# Patient Record
Sex: Male | Born: 1990 | Race: Black or African American | Hispanic: No | Marital: Single
Health system: Southern US, Community
[De-identification: ages and names within clinical notes are randomized; demographics above are authoritative.]

---
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
## 2014-12-01 ENCOUNTER — Emergency Department (HOSPITAL_COMMUNITY)
Admission: EM | Admit: 2014-12-01 | Discharge: 2014-12-01 | Disposition: A | Discharge status: Home / Self Care | Payer: BLUE CROSS/BLUE SHIELD | Attending: Emergency Medicine | Admitting: Emergency Medicine

## 2014-12-01 ENCOUNTER — Encounter (HOSPITAL_COMMUNITY): Payer: Self-pay

## 2014-12-01 DIAGNOSIS — F10920 Alcohol use, unspecified with intoxication, uncomplicated: Secondary | ICD-10-CM | POA: Insufficient documentation

## 2014-12-01 DIAGNOSIS — F1020 Alcohol dependence, uncomplicated: Secondary | ICD-10-CM | POA: Diagnosis present

## 2014-12-01 DIAGNOSIS — Z720 Tobacco use: Secondary | ICD-10-CM | POA: Insufficient documentation

## 2014-12-01 DIAGNOSIS — R45851 Suicidal ideations: Secondary | ICD-10-CM

## 2014-12-01 LAB — RAPID URINE DRUG SCREEN, HOSP PERFORMED
Amphetamines: NOT DETECTED
Barbiturates: NOT DETECTED
Benzodiazepines: NOT DETECTED
Cocaine: NOT DETECTED
Opiates: NOT DETECTED
Tetrahydrocannabinol: NOT DETECTED

## 2014-12-01 LAB — CBC
HCT: 43 % (ref 39.0–52.0)
Hemoglobin: 14.7 g/dL (ref 13.0–17.0)
MCH: 30.4 pg (ref 26.0–34.0)
MCHC: 34.2 g/dL (ref 30.0–36.0)
MCV: 88.8 fL (ref 78.0–100.0)
Platelets: 195 10*3/uL (ref 150–400)
RBC: 4.84 MIL/uL (ref 4.22–5.81)
RDW: 12.1 % (ref 11.5–15.5)
WBC: 7.2 10*3/uL (ref 4.0–10.5)

## 2014-12-01 LAB — COMPREHENSIVE METABOLIC PANEL
ALT: 16 U/L (ref 0–53)
AST: 18 U/L (ref 0–37)
Albumin: 5 g/dL (ref 3.5–5.2)
Alkaline Phosphatase: 31 U/L — ABNORMAL LOW (ref 39–117)
Anion gap: 8 (ref 5–15)
BUN: 14 mg/dL (ref 6–23)
CO2: 26 mmol/L (ref 19–32)
Calcium: 9.2 mg/dL (ref 8.4–10.5)
Chloride: 102 mmol/L (ref 96–112)
Creatinine, Ser: 0.73 mg/dL (ref 0.50–1.35)
GFR calc Af Amer: >90 mL/min (ref >90)
GFR calc non Af Amer: >90 mL/min (ref >90)
Glucose, Bld: 90 mg/dL (ref 70–99)
Potassium: 4 mmol/L (ref 3.5–5.1)
Sodium: 136 mmol/L (ref 135–145)
Total Bilirubin: 0.4 mg/dL (ref 0.3–1.2)
Total Protein: 7.8 g/dL (ref 6.0–8.3)

## 2014-12-01 LAB — SALICYLATE LEVEL: Salicylate Lvl: <4 mg/dL (ref 2.8–20.0)

## 2014-12-01 LAB — ACETAMINOPHEN LEVEL: Acetaminophen (Tylenol), Serum: <10 ug/mL — ABNORMAL LOW (ref 10–30)

## 2014-12-01 LAB — ETHANOL: Alcohol, Ethyl (B): 121 mg/dL — ABNORMAL HIGH (ref 0–9)

## 2014-12-01 MED ORDER — ACETAMINOPHEN 325 MG PO TABS: 650.0000 mg | ORAL_TABLET | ORAL | Status: DC | PRN

## 2014-12-01 MED ORDER — ONDANSETRON HCL 4 MG PO TABS: 4.0000 mg | ORAL_TABLET | Freq: Three times a day (TID) | ORAL | Status: DC | PRN

## 2014-12-01 MED ORDER — IBUPROFEN 200 MG PO TABS: 600.0000 mg | ORAL_TABLET | Freq: Three times a day (TID) | ORAL | Status: DC | PRN
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
## 2014-12-01 NOTE — Discharge Instructions (Signed)
Alcohol Intoxication Alcohol intoxication occurs when the amount of alcohol that a person has consumed impairs his or her ability to mentally and physically function. Alcohol directly impairs the normal chemical activity of the brain. Drinking large amounts of alcohol can lead to changes in mental function and behavior, and it can cause many physical effects that can be harmful.  Alcohol intoxication can range in severity from mild to very severe. Various factors can affect the level of intoxication that occurs, such as the person's age, gender, weight, frequency of alcohol consumption, and the presence of other medical conditions (such as diabetes, seizures, or heart conditions). Dangerous levels of alcohol intoxication may occur when people drink large amounts of alcohol in a short period (binge drinking). Alcohol can also be especially dangerous when combined with certain prescription medicines or "recreational" drugs. SIGNS AND SYMPTOMS Some common signs and symptoms of mild alcohol intoxication include:  Loss of coordination.  Changes in mood and behavior.  Impaired judgment.  Slurred speech. As alcohol intoxication progresses to more severe levels, other signs and symptoms will appear. These may include:  Vomiting.  Confusion and impaired memory.  Slowed breathing.  Seizures.  Loss of consciousness. DIAGNOSIS  Your health care provider will take a medical history and perform a physical exam. You will be asked about the amount and type of alcohol you have consumed. Blood tests will be done to measure the concentration of alcohol in your blood. In many places, your blood alcohol level must be lower than 80 mg/dL (0.08%) to legally drive. However, many dangerous effects of alcohol can occur at much lower levels.  TREATMENT  People with alcohol intoxication often do not require treatment. Most of the effects of alcohol intoxication are temporary, and they go away as the alcohol naturally  leaves the body. Your health care provider will monitor your condition until you are stable enough to go home. Fluids are sometimes given through an IV access tube to help prevent dehydration.  HOME CARE INSTRUCTIONS  Do not drive after drinking alcohol.  Stay hydrated. Drink enough water and fluids to keep your urine clear or pale yellow. Avoid caffeine.   Only take over-the-counter or prescription medicines as directed by your health care provider.  SEEK MEDICAL CARE IF:   You have persistent vomiting.   You do not feel better after a few days.  You have frequent alcohol intoxication. Your health care provider can help determine if you should see a substance use treatment counselor. SEEK IMMEDIATE MEDICAL CARE IF:   You become shaky or tremble when you try to stop drinking.   You shake uncontrollably (seizure).   You throw up (vomit) blood. This may be bright red or may look like black coffee grounds.   You have blood in your stool. This may be bright red or may appear as a black, tarry, bad smelling stool.   You become lightheaded or faint.  MAKE SURE YOU:   Understand these instructions.  Will watch your condition.  Will get help right away if you are not doing well or get worse. Document Released: 07/03/2005 Document Revised: 05/26/2013 Document Reviewed: 02/26/2013 [facility] Patient Information 2015 [place], [place]. This information is not intended to replace advice given to you by your health care provider. Make sure you discuss any questions you have with your health care provider. Suicidal Feelings, How to Help Yourself Everyone feels sad or unhappy at times, but depressing thoughts and feelings of hopelessness can lead to thoughts of suicide. It  can seem as if life is too tough to handle. If you feel as though you have reached the point where suicide is the only answer, it is time to let someone know immediately.  HOW TO COPE AND PREVENT SUICIDE  Let family,  friends, teachers, or counselors know. Get help. Try not to isolate yourself from those who care about you. Even though you may not feel sociable, talk with someone every day. It is best if it is face-to-face. Remember, they will want to help you.  Eat a regularly spaced and well-balanced diet.  Get plenty of rest.  Avoid alcohol and drugs because they will only make you feel worse and may also lower your inhibitions. Remove them from the home. If you are thinking of taking an overdose of your prescribed medicines, give your medicines to someone who can give them to you one day at a time. If you are on antidepressants, let your caregiver know of your feelings so he or she can provide a safer medicine, if that is a concern.  Remove weapons or poisons from your home.  Try to stick to routines. Follow a schedule and remind yourself that you have to keep that schedule every day.  Set some realistic goals and achieve them. Make a list and cross things off as you go. Accomplishments give a sense of worth. Wait until you are feeling better before doing things you find difficult or unpleasant to do.  If you are able, try to start exercising. Even half-hour periods of exercise each day will make you feel better. Getting out in the sun or into nature helps you recover from depression faster. If you have a favorite place to walk, take advantage of that.  Increase safe activities that have always given you pleasure. This may include playing your favorite music, reading a good book, painting a picture, or playing your favorite instrument. Do whatever takes your mind off your depression.  Keep your living space well-lighted. GET HELP Contact a suicide hotline, crisis center, or local suicide prevention center for help right away. Local centers may include a hospital, clinic, community service organization, social service provider, or health department.  Call your local emergency services (911 in the [place]).  Call a suicide hotline:  1-800-273-TALK (1-949-593-2585) in the [place].  1-800-SUICIDE (570)305-2082) in the [place].  215-366-7226 in the [place] for Spanish-speaking counselors.  5-462-703-5KKX 973-309-1866) in the [place] for TTY users.  Visit the following websites for information and help:  National Suicide Prevention Lifeline: www.suicidepreventionlifeline.org  Hopeline: www.hopeline.[place] for Suicide Prevention: PromotionalLoans.co.za  For lesbian, gay, bisexual, transgender, or questioning youth, contact [employer]:  9-678-9-F-YBOFBP (435)676-5514) in the [place].  www.thetrevorproject.org  In [place], treatment resources are listed in each [employer] with listings available under USAA for [employer] or similar titles. Another source for Crisis Centres by [place] is located at http://www.suicideprevention.ca/in-crisis-now/find-a-crisis-centre-now/crisis-centres Document Released: 03/30/2003 Document Revised: 12/16/2011 Document Reviewed: 01/18/2014 [employer] [facility] Patient Information 2015 [place], [place]. This information is not intended to replace advice given to you by your health care provider. Make sure you discuss any questions you have with your health care provider.  Emergency Department Resource Guide 1) Find a Doctor and Pay Out of Pocket Although you won't have to find out who is covered by your insurance plan, it is a good idea to ask around and get recommendations. You will then need to call the office and see if the doctor you have  chosen will accept you as a new patient and what types of options they offer for patients who are self-pay. Some doctors offer discounts or will set up payment plans for their patients who do not have insurance, but you will need to ask so you aren't surprised when you get to your appointment.  2) Contact Your Local Health Department Not all health departments  have doctors that can see patients for sick visits, but many do, so it is worth a call to see if yours does. If you don't know where your local health department is, you can check in your phone book. The CDC also has a tool to help you locate your state's health department, and many state websites also have listings of all of their local health departments.  3) Find a [facility] If your illness is not likely to be very severe or complicated, you may want to try a walk in clinic. These are popping up all over the country in pharmacies, drugstores, and shopping centers. They're usually staffed by nurse practitioners or physician assistants that have been trained to treat common illnesses and complaints. They're usually fairly quick and inexpensive. However, if you have serious medical issues or chronic medical problems, these are probably not your best option.  No Primary Care Doctor: - Call Health Connect at  [phone] - they can help you locate a primary care doctor that  accepts your insurance, provides certain services, etc. - Physician Referral Service- [phone]  Chronic Pain Problems: Organization         Address  Phone   Notes  [facility]  [phone] Patients need to be referred by their primary care doctor.   Medication Assistance: Organization         Address  Phone   Notes  [facility] Medication [facility] [address] [place], [place] [address] [phone] --Must be a resident of [facility] -- Must have NO insurance coverage whatsoever (no Medicaid/ Medicare, etc.) -- The pt. MUST have a primary care doctor that directs their care regularly and follows them in the community   MedAssist  [phone]   [employer]  [phone]    Agencies that provide inexpensive medical care: Organization         Address  Phone   Notes  [place]  [phone]   [facility]    [phone]    [facility] [place], [place] [address] [phone]   [place] [address] [place] [phone]   Planned Parenthood    [phone]   [facility]    [phone]   [place] and [place], [place] Phone:  [phone], Fax:  [phone] Hours of Operation:  9 am - 6 pm, M-F.  Also accepts Medicaid/Medicare and self-pay.  [facility] for [place] [address], [address], [place] Phone: [phone], Fax: [phone]. Hours of Operation:  8:30 am - 5:30 pm, M-F.  Also accepts Medicaid and self-pay.  [facility] [place] [address], [place] Phone: [phone]   [place], [address], [place] [phone], Ext. [phone] Mondays & Thursdays: 7-9 AM.  First 15 patients are seen on a first come, first serve basis.    [place] Providers:  Organization  Address  Phone   Notes  [facility] [address], [place] [phone] Also accepts self-pay patients.  [facility] [address], [place]  [phone]   [place], [address], [place] [phone]   [facility] [address] [place] [phone]   Lucianne Lei [address], [place]   [phone] Only accepts [place] Access [place] patients after they have their name applied to their card.   Self-Pay (no insurance) in [facility]:  Organization         Address  Phone   Notes  Sickle Cell Patients, [facility] [place] [phone]   [facility]gent Care [place] [phone]   [facility] [place]  [place], [place],  [phone]   [facility]/Dr. Osei-Bonsu  [address] [place] or [address], [employer] [phone] Phone number for both [place] and [place] locations is the same.  Urgent Medical and [facility] [address] [place] [phone]   [facility] [address], [place] or [address] [phone]   [facility] [address], [place] [phone], phone; [phone], fax Sees patients 1st and 3rd Saturday of every month.  Must not qualify for public or private insurance (i.e. Medicaid, Medicare, [place] Health Choice, Veterans' Benefits)  Household income should be no more than 200% of the poverty level The clinic cannot treat you if you are pregnant or think you are pregnant  Sexually transmitted diseases are not treated at the clinic.    Dental Care: Organization         Address  Phone  Notes  [facility] Department of [facility] [place] [phone] Accepts children up to age 23 who are enrolled in [place] or [place]; pregnant women with a Medicaid card; and children who have applied for Medicaid or [place] Health Choice, but were declined, whose parents can pay a reduced fee at time of service.  [facility] Department of [facility]  [address], [place] [phone] Accepts children up to age 56 who are enrolled in [place] or [place]; pregnant women with a Medicaid card; and children who have applied for Medicaid or [place] Health Choice, but were declined, whose parents can pay a reduced fee at time of service.  [place] Adult Dental Access PROGRAM  [place] [phone] Patients are seen by appointment only. Walk-ins are not accepted. [place] will see patients 84 years of age and older. Monday - Tuesday (8am-5pm) Most Wednesdays (8:30-5pm) $30 per visit, cash only  [facility]  Adult Dental Access PROGRAM  [address], [facility] [phone] Patients are seen by appointment only. Walk-ins are not accepted. [place] will see patients 21 years of age and older. One Wednesday Evening (Monthly: Volunteer Based).  $30 per visit, cash only  [employer]  [phone] for adults; Children under age 81, call Graduate Pediatric Dentistry at [phone]. Children aged 71-14, please call [phone] to request a pediatric application.  Dental services are provided in all areas of dental care including  fillings, crowns and bridges, complete and partial dentures, implants, gum treatment, root canals, and extractions. Preventive care is also provided. Treatment is provided to both adults and children. Patients are selected via a lottery and there is often a waiting list.   [facility] [address] [place]  [phone] www.drcivils.com   [facility] [address] [place], [place] [phone], Ext. [phone] Second and Fourth Thursday of each month, opens at 6:30 AM; Clinic ends at 9 AM.  Patients are seen on a first-come first-served basis, and a limited number are seen during each clinic.   [facility]  [address] Hillard Danker [place], [place] [phone]   Eligibility Requirements You must have lived in [place], [place], or [place] counties for at least the last three months.   You cannot be eligible for state or federal sponsored [employer], including [employer], [place], or [employer].   You generally cannot be eligible for healthcare insurance through your employer.    How to apply: Eligibility screenings are held every Tuesday and Wednesday afternoon from 1:00 pm until 4:00 pm. You do not need an appointment for the interview!  [facility] [address] [place], [place]   [place]  [place] Department  [place]  [phone]    Behavioral Health Resources in the Community: Intensive Outpatient Programs Organization         Address  Phone  Notes  [place]. [address] [place], [place] [phone]   [facility] Outpatient [address], [place], [place]   ADS: Alcohol & Drug Svcs [address] [place], [place]   [place] [address]  [place], [place] or [phone]   Substance Abuse Resources Organization         Address  Phone  Notes  Alcohol and Drug Services  ([phone]   [place]  [phone]   The [place]   Chinita Pester  [phone]   Residential & Outpatient Substance Abuse Program  [phone]   Psychological Services Organization         Address  Phone  Notes  [facility] [place]  [place]  [phone]   [place] [address] [place] or [phone]    Mobile Crisis Teams Organization         Address  Phone  Notes  [facility], Mobile Crisis Care Unit  [phone]   [facility]  [address] [place], [place]   [place] [address] [employer] [phone]    Self-Help/Support Groups Organization         Address  Phone             Notes  [place]. of [place] - variety of support groups  [place] Call for more information  Narcotics Anonymous (NA), [facility] [address], [employer] [place]  2 meetings at this location   Special educational needs teacher         Address  Phone  Notes  [facility] [place],    [place]  [place]  [address], [place] [address]17, [place], [place]   [place],  [place] [phone] Admissions: 8am-3pm M-F  Incentives Substance [place] [address]    [place], [place] [phone]   [facility] [address] [place], [place], [place]   [facility] [address]  [place], [place]   [facility] Programs - Intensive Outpatient [address] Kristeen Mans [address], [employer], [place]   [facility] [address].,  [place], [place] [phone]-([phone] or [phone]   [facility] Treatment Services (RTS) [address] [place], [place] Accepts Medicaid  [facility] [place] [address]  [place] 1-989-464-3372 Substance Abuse/Addiction Treatment   [facility] Organization         Address  Phone  Notes  [facility]  [phone]   Domenic Schwab, PhD [address] Arlis Porta [place], [place]   [phone] or [phone]   [place], [place] [phone]   [facility] [address] [place], [place] [phone] Insurance/Medicaid/sponsorship through [facility] and Families [address]                                    [place], [place] [phone] [place] [address] [place], [place] [phone]    Dr. Adele Schilder  [phone]   [facility] Dept. 1) [address], [place] 2) [place] 3)  [place] [address], [place] [phone]  [phone]   [place] [phone] or [phone] (After Hours)

## 2014-12-01 NOTE — BH Assessment (Signed)
Patient presented to [facility] under IVC. The psychiatrist Dr. Darleene Cleaver, MD will rescind IVC. Once all IVC has been rescinded patient's nurse will be made aware to prepare patient for final discharge.

## 2014-12-01 NOTE — ED Notes (Signed)
Patient arrived via [facility] officer with IVC papers.  Officer reports that patient was pulled over for DUI a couple hours ago and has made several remarks to the officer along the lines of suicide.  He has said things like, "I want to end it," and "I wish you could just shoot me."

## 2014-12-01 NOTE — ED Notes (Signed)
Patient undressed and wanded.  Now wearing paper scrubs.  Officer at bedside informed he could leave at [place] if patient continues to be cooperative.

## 2014-12-01 NOTE — ED Notes (Signed)
Pt getting dressed.

## 2014-12-01 NOTE — ED Notes (Signed)
Refused breakfast tray

## 2014-12-01 NOTE — BH Assessment (Signed)
Assessment Note  Adam Baird is an 24 y.o. male that presented to [facility] via [employer]. Patient reportedly obtained a DUI last night. Sts that he was arrested and placed in the back of a [employer]'s car.  He admits to telling the officer as he was sitting in the car, "Life doesn't matter". Writer asked patient to elaborate about his thoughts related to suicide and/or depression. The patient reports that he has had suicidal thoughts since child hood. Sts, "I've always been sad and felt down". Patient does not have a history of suicidal attempts and/or gestures. No self mutilating behaviors. Patient even denies a suicide plan with this Probation officer. However, ED notes indicate a  plan that he's had at times was to hang himself.  Patient did not report specific events that it caused him to feel depressed or despondent. However, patient did in fact obtain a DUI last night and has a upcoming court date. Patient later recognized this as a upcoming stressor.  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 He doesn't have any family supports. Lives with a brother who is home 2-3x's per week. Patient denies drug use. His UDS was negative. Patient admits that he drinks alcohol daily or every other day at the very least. His last drink was yesterday. No withdrawal symptoms reported. No history of seizures indicated. No hx of black outs.   Axis I: Alcohol Dependence and Depressive Disorder NOS Axis II: Deferred Axis III: History reviewed. No pertinent past medical history. Axis IV: other psychosocial or environmental problems, problems related to social environment, problems with access to health care services and problems with primary support group Axis V: 31-40 impairment in reality testing  Past Medical History: History reviewed. No pertinent past medical history.  History reviewed. No pertinent past surgical history.  Family History: History reviewed. No pertinent family history.  Social History:  reports  that he has been smoking.  He does not have any smokeless tobacco history on file. He reports that he drinks alcohol. He reports that he does not use illicit drugs.  Additional Social History:  Alcohol / Drug Use Pain Medications: SEE MAR Prescriptions: SEE MAR Over the Counter: SEE MAR History of alcohol / drug use?: Yes Substance #1 Name of Substance 1: Alcohol  1 - Age of First Use: "16 or 24 yrs old" 1 - Amount (size/oz): up to 4 beers 1 - Frequency: "daily or every other day" 1 - Duration: on-going  1 - Last Use / Amount: 12/01/2014; patient reports drinking approx. 4 beers  CIWA: CIWA-Ar BP: 128/89 mmHg Pulse Rate: 68 COWS:    Allergies: No Known Allergies  Home Medications:  (Not in a hospital admission)  OB/GYN Status:  No LMP for male patient.  General Assessment Data Location of Assessment: [facility] ED Is this a Tele or Face-to-Face Assessment?: Face-to-Face Is this an Initial Assessment or a Re-assessment for this encounter?: Initial Assessment Living Arrangements: Other (Comment) Can pt return to current living arrangement?: No Admission Status: Voluntary Is patient capable of signing voluntary admission?: Yes Transfer from: [facility] Referral Source: Self/Family/Friend  Medical Screening Exam ([place]) Medical Exam completed: No  [place] Living Arrangements: Other (Comment) Name of Psychiatrist:  (No psychiatrist ) Name of Therapist:  (No therapist )  Education Status Is patient currently in school?: No  Risk to self with the past 6 months Suicidal Ideation: Yes-Currently Present Suicidal Intent: No Is patient at risk for suicide?:  No Suicidal Plan?: No Access to Means: No What has been your use of drugs/alcohol within the last 12 months?:  (n/a) Previous Attempts/Gestures: No How many times?:  (n/a) Other Self Harm Risks:  (n/a) Triggers for Past Attempts: Other (Comment) (patient ) Intentional Self Injurious Behavior:  None Family Suicide History: No Recent stressful life event(s): Other (Comment) (patient does not identify any stressors (in school and works) Persecutory voices/beliefs?: No Depression: Yes Depression Symptoms: Feeling angry/irritable, Feeling worthless/self pity, Loss of interest in usual pleasures, Guilt, Fatigue, Isolating, Tearfulness, Insomnia, Despondent Substance abuse history and/or treatment for substance abuse?: No Suicide prevention information given to non-admitted patients: Not applicable  Risk to Others within the past 6 months Homicidal Ideation: No Thoughts of Harm to Others: No Current Homicidal Intent: No Current Homicidal Plan: No Access to Homicidal Means: No Identified Victim:  (n/a) History of harm to others?: No Assessment of Violence: None Noted Violent Behavior Description:  (patient is calm and cooperative ) Does patient have access to weapons?: No Criminal Charges Pending?: Yes Describe Pending Criminal Charges:  (patient obtained a DUI last night ) Does patient have a court date: No  Psychosis Hallucinations: None noted Delusions: None noted  Mental Status Report Appear/Hygiene: Disheveled Eye Contact: Good Motor Activity: Freedom of movement Speech: Logical/coherent Level of Consciousness: Alert Mood: Depressed Affect: Appropriate to circumstance Anxiety Level: None Thought Processes: Coherent, Relevant Judgement: Impaired Orientation: Person, Place, Time, Situation Obsessive Compulsive Thoughts/Behaviors: None  Cognitive Functioning Concentration: Decreased Memory: Recent Intact, Remote Intact IQ: Average Insight: Fair Impulse Control: Fair Appetite: Fair Weight Loss:  (none reported ) Weight Gain:  (none reported ) Sleep:  ("Varies") Total Hours of Sleep:  (patient unable to provide specific details ) Vegetative Symptoms: None  ADLScreening [facility] Assessment Services) Patient's cognitive ability adequate to safely complete daily  activities?: Yes Patient able to express need for assistance with ADLs?: Yes Independently performs ADLs?: Yes (appropriate for developmental age)  Prior Inpatient Therapy Prior Inpatient Therapy: No Prior Therapy Dates:  (n/a) Prior Therapy Facilty/Provider(s):  (n/a) Reason for Treatment:  (n/a)  Prior Outpatient Therapy Prior Outpatient Therapy: No Prior Therapy Dates:  (n/a) Prior Therapy Facilty/Provider(s):  (n/a) Reason for Treatment:  (n/a)  ADL Screening (condition at time of admission) Patient's cognitive ability adequate to safely complete daily activities?: Yes Is the patient deaf or have difficulty hearing?: No Does the patient have difficulty seeing, even when wearing glasses/contacts?: No Does the patient have difficulty concentrating, remembering, or making decisions?: Yes Patient able to express need for assistance with ADLs?: Yes Does the patient have difficulty dressing or bathing?: No Independently performs ADLs?: Yes (appropriate for developmental age) Does the patient have difficulty walking or climbing stairs?: No Weakness of Legs: None Weakness of Arms/Hands: None  Home Assistive Devices/Equipment Home Assistive Devices/Equipment: None    Abuse/Neglect Assessment (Assessment to be complete while patient is alone) Physical Abuse: Denies Verbal Abuse: Denies Sexual Abuse: Denies Exploitation of patient/patient's resources: Denies Self-Neglect: Denies Values / Beliefs Cultural Requests During Hospitalization: None Spiritual Requests During Hospitalization: None   Advance Directives (For Healthcare) Does patient have an advance directive?: No Would patient like information on creating an advanced directive?: No - patient declined information    Additional Information 1:1 In Past 12 Months?: No CIRT Risk: No Elopement Risk: No Does patient have medical clearance?: Yes     Disposition:  Disposition Initial Assessment Completed for this  Encounter: Yes Disposition of Patient: Other dispositions (Disposition pending evaluation by psychiatry)  On  Site Evaluation by:   Reviewed with Physician:    Waldon Merl [facility] 12/01/2014 9:48 AM

## 2014-12-01 NOTE — BH Assessment (Signed)
Writer informed TTS ([place]) of the consult.  

## 2014-12-01 NOTE — ED Notes (Signed)
MD at bedside. 

## 2014-12-01 NOTE — BH Assessment (Signed)
Dr. Darleene Cleaver and Reginold Agent, NP psychiatrically evaluated patient this morning. Patient was psychiatrically cleared. Writer has provided patient with a list of referrals (substance abuse and mental health) prior to discharge home.

## 2014-12-01 NOTE — ED Provider Notes (Signed)
CSN: [id number]     Arrival date & time 12/01/14  5631 History   First MD Initiated Contact with Patient 12/01/14 [phone]     Chief Complaint  Patient presents with  . Suicidal     (Consider location/radiation/quality/duration/timing/severity/associated sxs/prior Treatment) HPI The patient reports that he has had suicidal thoughts since sixth grade. He denies he's ever acted on these. He reports a plan that he's had at times was to hang himself. The patient got pulled over for a DUI this evening and commented to officers that he wanted to end it and he wished that they would just shoot him. When asked if he was suicidal the patient responded, "I don't know, I'm always felt this way. It doesn't matter." Patient did not report specific events that it caused him to feel depressed or despondent. He reports he just always has things to deal with, like this DUI. History reviewed. No pertinent past medical history. History reviewed. No pertinent past surgical history. History reviewed. No pertinent family history. History  Substance Use Topics  . Smoking status: Current Every Day Smoker  . Smokeless tobacco: Not on file  . Alcohol Use: Yes     Comment: socially    Review of Systems  10 Systems reviewed and are negative for acute change except as noted in the HPI.   Allergies  Review of patient's allergies indicates no known allergies.  Home Medications   Prior to Admission medications   Not on File   BP 126/68 mmHg  Pulse 66  Temp(Src) 97.6 F (36.4 C) (Oral)  Resp 18  Ht 6\' 1"  (1.854 m)  Wt 170 lb (77.111 kg)  BMI 22.43 kg/m2  SpO2 100% Physical Exam  Constitutional: He is oriented to person, place, and time. He appears well-developed and well-nourished.  HENT:  Head: Normocephalic and atraumatic.  Eyes: EOM are normal. Pupils are equal, round, and reactive to light.  Neck: Neck supple.  Cardiovascular: Normal rate, regular rhythm, normal heart sounds and intact distal  pulses.   Pulmonary/Chest: Effort normal and breath sounds normal.  Abdominal: Soft. Bowel sounds are normal. He exhibits no distension. There is no tenderness.  Musculoskeletal: Normal range of motion. He exhibits no edema.  Neurological: He is alert and oriented to person, place, and time. He has normal strength. Coordination normal. GCS eye subscore is 4. GCS verbal subscore is 5. GCS motor subscore is 6.  Skin: Skin is warm, dry and intact.  Psychiatric: He has a normal mood and affect.    ED Course  Procedures (including critical care time) Labs Review Labs Reviewed  ACETAMINOPHEN LEVEL - Abnormal; Notable for the following:    Acetaminophen (Tylenol), Serum <10.0 (*)    All other components within normal limits  COMPREHENSIVE METABOLIC PANEL - Abnormal; Notable for the following:    Alkaline Phosphatase 31 (*)    All other components within normal limits  ETHANOL - Abnormal; Notable for the following:    Alcohol, Ethyl (B) 121 (*)    All other components within normal limits  CBC  SALICYLATE LEVEL  URINE RAPID DRUG SCREEN (HOSP PERFORMED)    Imaging Review No results found.   EKG Interpretation None      MDM   Final diagnoses:  Acute alcohol intoxication, uncomplicated  Passive suicidal ideations   The patient has been assessed by psychiatry for passive type suicidal thoughts. At this time his mental status is clear and he is not intoxicated. The patient does not show signs of  chronic alcoholism. He is an otherwise well-appearing male. Resources have been provided for ongoing outpatient treatment.    Charlesetta Shanks, MD 12/01/14 [phone]

## 2015-03-20 ENCOUNTER — Encounter (HOSPITAL_COMMUNITY): Payer: Self-pay | Admitting: Emergency Medicine

## 2015-03-20 ENCOUNTER — Emergency Department (HOSPITAL_COMMUNITY)
Admission: EM | Admit: 2015-03-20 | Discharge: 2015-03-20 | Disposition: A | Discharge status: Home / Self Care | Payer: BLUE CROSS/BLUE SHIELD | Attending: Emergency Medicine | Admitting: Emergency Medicine

## 2015-03-20 ENCOUNTER — Ambulatory Visit: Admit: 2015-03-20 | Payer: Self-pay | Admitting: Orthopedic Surgery

## 2015-03-20 ENCOUNTER — Encounter (HOSPITAL_COMMUNITY): Payer: Self-pay | Admitting: Anesthesiology

## 2015-03-20 ENCOUNTER — Emergency Department (HOSPITAL_COMMUNITY): Payer: BLUE CROSS/BLUE SHIELD

## 2015-03-20 ENCOUNTER — Encounter (HOSPITAL_COMMUNITY)
Admission: EM | Disposition: A | Discharge status: Home / Self Care | Payer: Self-pay | Source: Home / Self Care | Attending: Emergency Medicine

## 2015-03-20 DIAGNOSIS — S66921A Laceration of unspecified muscle, fascia and tendon at wrist and hand level, right hand, initial encounter: Secondary | ICD-10-CM

## 2015-03-20 DIAGNOSIS — Y999 Unspecified external cause status: Secondary | ICD-10-CM | POA: Insufficient documentation

## 2015-03-20 DIAGNOSIS — S66821A Laceration of other specified muscles, fascia and tendons at wrist and hand level, right hand, initial encounter: Secondary | ICD-10-CM | POA: Diagnosis not present

## 2015-03-20 DIAGNOSIS — Y92009 Unspecified place in unspecified non-institutional (private) residence as the place of occurrence of the external cause: Secondary | ICD-10-CM | POA: Diagnosis not present

## 2015-03-20 DIAGNOSIS — Z79899 Other long term (current) drug therapy: Secondary | ICD-10-CM | POA: Diagnosis not present

## 2015-03-20 DIAGNOSIS — Y9389 Activity, other specified: Secondary | ICD-10-CM | POA: Insufficient documentation

## 2015-03-20 DIAGNOSIS — W2209XA Striking against other stationary object, initial encounter: Secondary | ICD-10-CM | POA: Diagnosis not present

## 2015-03-20 DIAGNOSIS — Z720 Tobacco use: Secondary | ICD-10-CM | POA: Insufficient documentation

## 2015-03-20 DIAGNOSIS — IMO0002 Reserved for concepts with insufficient information to code with codable children: Secondary | ICD-10-CM

## 2015-03-20 DIAGNOSIS — S61411A Laceration without foreign body of right hand, initial encounter: Secondary | ICD-10-CM

## 2015-03-20 DIAGNOSIS — S6991XA Unspecified injury of right wrist, hand and finger(s), initial encounter: Secondary | ICD-10-CM | POA: Diagnosis present

## 2015-03-20 SURGERY — WOUND EXPLORATION
Anesthesia: General | Laterality: Right

## 2015-03-20 MED ORDER — LIDOCAINE HCL (PF) 1 % IJ SOLN
5.0000 mL | Freq: Once | INTRAMUSCULAR | Status: AC
  Administered 2015-03-20: 5 mL
  Filled 2015-03-20: qty 5

## 2015-03-20 MED ORDER — LIDOCAINE-EPINEPHRINE (PF) 2 %-1:200000 IJ SOLN
10.0000 mL | Freq: Once | INTRAMUSCULAR | Status: AC
  Administered 2015-03-20: 10 mL
  Filled 2015-03-20: qty 20

## 2015-03-20 NOTE — Discharge Instructions (Signed)

## 2015-03-20 NOTE — ED Notes (Signed)
Cleaned pt's hand and placed dressing over site.

## 2015-03-20 NOTE — ED Notes (Signed)
Pt has lac on right hand extending from between the fifth and fourth fingers approx 4.5 cm long.
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
## 2015-03-20 NOTE — ED Provider Notes (Signed)
Preprocedure  Pre-anesthesia/induction confirmation of laterality/correct procedure site including "time-out."  Provider confirms review of the nurses' note, allergies, medications, pertinent labs, PMH, pre-induction vital signs, pulse oximetry, pain level, and ECG (as applicable), and patient condition satisfactory for commencing with order for and procedure.  Right forearm was thoroughly cleaned with Chloraprep. Right upper extremity ulnar nerve block and median nerve block were performed under ultrasound guidance.  Images are archived. Block successful and provided an aesthetic effect.  Wound thoroughly irrigated. Further exploration revealed a lacerated FDS. Hand surgeon consulted and will see the patient in the outpatient surgical center for repair following discharge today. Tacking sutures placed and wound bandaged.  Patient primarily seen by Dr. Alvino Chapel. Please see his note for further history of present illness and exam including ED course.  Sibyl Parr, M.D. Resident  Addison Lank, MD 03/20/15 [place], MD 03/23/15 [phone]
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
## 2020-06-09 ENCOUNTER — Other Ambulatory Visit: Payer: Self-pay | Admitting: Sleep Medicine

## 2020-06-09 ENCOUNTER — Other Ambulatory Visit: Payer: Self-pay

## 2020-06-09 DIAGNOSIS — I471 Supraventricular tachycardia, unspecified: Secondary | ICD-10-CM

## 2020-06-10 LAB — NOVEL CORONAVIRUS, NAA: SARS-CoV-2, NAA: NOT DETECTED

## 2020-08-24 ENCOUNTER — Ambulatory Visit (INDEPENDENT_AMBULATORY_CARE_PROVIDER_SITE_OTHER): Payer: Self-pay | Admitting: Podiatry

## 2020-08-24 ENCOUNTER — Other Ambulatory Visit: Payer: Self-pay

## 2020-08-24 ENCOUNTER — Encounter: Payer: Self-pay | Admitting: Podiatry

## 2020-08-24 DIAGNOSIS — B070 Plantar wart: Secondary | ICD-10-CM

## 2020-08-24 NOTE — Progress Notes (Signed)
  Subjective:  Patient ID: Adam Baird, male    DOB: 1991/04/06,  MRN: [id number] HPI Chief Complaint  Patient presents with  . Foot Pain    Plantar forefoot left - callused area x 3 years, been using OTC wart meds and padding, area macerated today, tender  . New Patient (Initial Visit)    29 y.o. male presents with the above complaint.   ROS: Denies fever chills nausea vomiting muscle aches pains calf pain back pain chest pain shortness of breath.  No past medical history on file. No past surgical history on file. No current outpatient medications on file.  No Known Allergies Review of Systems Objective:  There were no vitals filed for this visit.  General: Well developed, nourished, in no acute distress, alert and oriented x3   Dermatological: Skin is warm, dry and supple bilateral. Nails x 10 are well maintained; remaining integument appears unremarkable at this time. There are no open sores, no preulcerative lesions, no rash or signs of infection present.  Verrucoid lesion measuring about a centimeter in diameter thrombosed capillaries are visible on skin lines do circumvent the lesion consistent with verruca plantaris.  Vascular: Dorsalis Pedis artery and Posterior Tibial artery pedal pulses are 2/4 bilateral with immedate capillary fill time. Pedal hair growth present. No varicosities and no lower extremity edema present bilateral.   Neruologic: Grossly intact via light touch bilateral. Vibratory intact via tuning fork bilateral. Protective threshold with Semmes Wienstein monofilament intact to all pedal sites bilateral. Patellar and Achilles deep tendon reflexes 2+ bilateral. No Babinski or clonus noted bilateral.   Musculoskeletal: No gross boney pedal deformities bilateral. No pain, crepitus, or limitation noted with foot and ankle range of motion bilateral. Muscular strength 5/5 in all groups tested bilateral.  Gait: Unassisted, Nonantalgic.    Radiographs:  None  taken  Assessment & Plan:   Assessment: Verruca plantaris plantar aspect forefoot left  Plan: After local anesthetic was administered consisting of Marcaine plain and lidocaine with epinephrine 50-50 mixture total of 3 cc was injected sublesional he area was prepped and draped in normal sterile fashion.  A curettage was performed of the lesion and was sent for pathologic evaluation.  Betadine ointment Telfa pad dressed a compressive dressing was applied he was given both oral and written instructions for care and soaking of the lesion.  I will follow-up with him in 2 weeks to make sure he is healing well.     Janazia Schreier T. [place], [place]

## 2020-08-24 NOTE — Patient Instructions (Signed)

## 2020-08-29 LAB — PATHOLOGY REPORT

## 2020-08-29 LAB — TISSUE SPECIMEN

## 2020-09-14 ENCOUNTER — Ambulatory Visit (INDEPENDENT_AMBULATORY_CARE_PROVIDER_SITE_OTHER): Payer: Self-pay | Admitting: Podiatry

## 2020-09-14 ENCOUNTER — Other Ambulatory Visit: Payer: Self-pay

## 2020-09-14 DIAGNOSIS — B070 Plantar wart: Secondary | ICD-10-CM

## 2020-09-14 NOTE — Progress Notes (Signed)
He presents today with his mother for follow-up of his excision plantar wart left foot.  Denies fever chills nausea vomiting states that it feels very good compared to what it did.  Objective: Vital signs stable alert oriented x3 excision site is healing very nicely granulation tissue is epithelializing.  Assessment well-healing surgical foot.  Plan: Encouraged him to continue to keep an antibiotic ointment on it over the next few days.  Pathology result was indicative of verrucoid lesion with a keratotic core.

## 2022-04-02 ENCOUNTER — Ambulatory Visit: Payer: Self-pay | Admitting: Podiatry

## 2022-05-09 ENCOUNTER — Ambulatory Visit (INDEPENDENT_AMBULATORY_CARE_PROVIDER_SITE_OTHER): Payer: Self-pay | Admitting: Podiatry

## 2022-05-09 ENCOUNTER — Encounter: Payer: Self-pay | Admitting: Podiatry

## 2022-05-09 DIAGNOSIS — D2371 Other benign neoplasm of skin of right lower limb, including hip: Secondary | ICD-10-CM

## 2022-05-09 DIAGNOSIS — D2372 Other benign neoplasm of skin of left lower limb, including hip: Secondary | ICD-10-CM

## 2022-05-09 NOTE — Progress Notes (Signed)
He presents today for follow-up of his plantar warts.  He states that these calluses have come back and he would like to have them treated again.  Last time we remove them since of pathology which did demonstrate a verrucoid type callus.  Objective: Vital signs are stable alert oriented x3 he has a subtwo lesion left foot some fourth right foot pulses are palpable.  Skin lines do not circumvent these lesions there are no thrombosed capillaries visible.  These appear to be Deep calluses.  Assessment painful benign skin lesions bilateral foot.  Plan: Mechanical debridement followed by Cantharone under occlusion to be left on for a day then washed off thoroughly.  Follow-up with him on an as-needed basis.

## 2022-12-10 ENCOUNTER — Telehealth: Payer: Self-pay

## 2022-12-10 NOTE — Telephone Encounter (Signed)
error
